# Patient Record
Sex: Female | Born: 1995 | Race: Black or African American | Hispanic: No | Marital: Single | Smoking: Never smoker
Health system: Southern US, Community
[De-identification: ages and names within clinical notes are randomized; demographics above are authoritative.]

---
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
## 2017-07-12 IMAGING — DX DG SHOULDER 2+V*R*
3 series · 3 of 3 positions shown · non-contrast
Comparison: None.

CLINICAL DATA: Right shoulder pain upon waking today.

EXAM:
RIGHT SHOULDER - 2+ VIEW

[shoulder grashey]
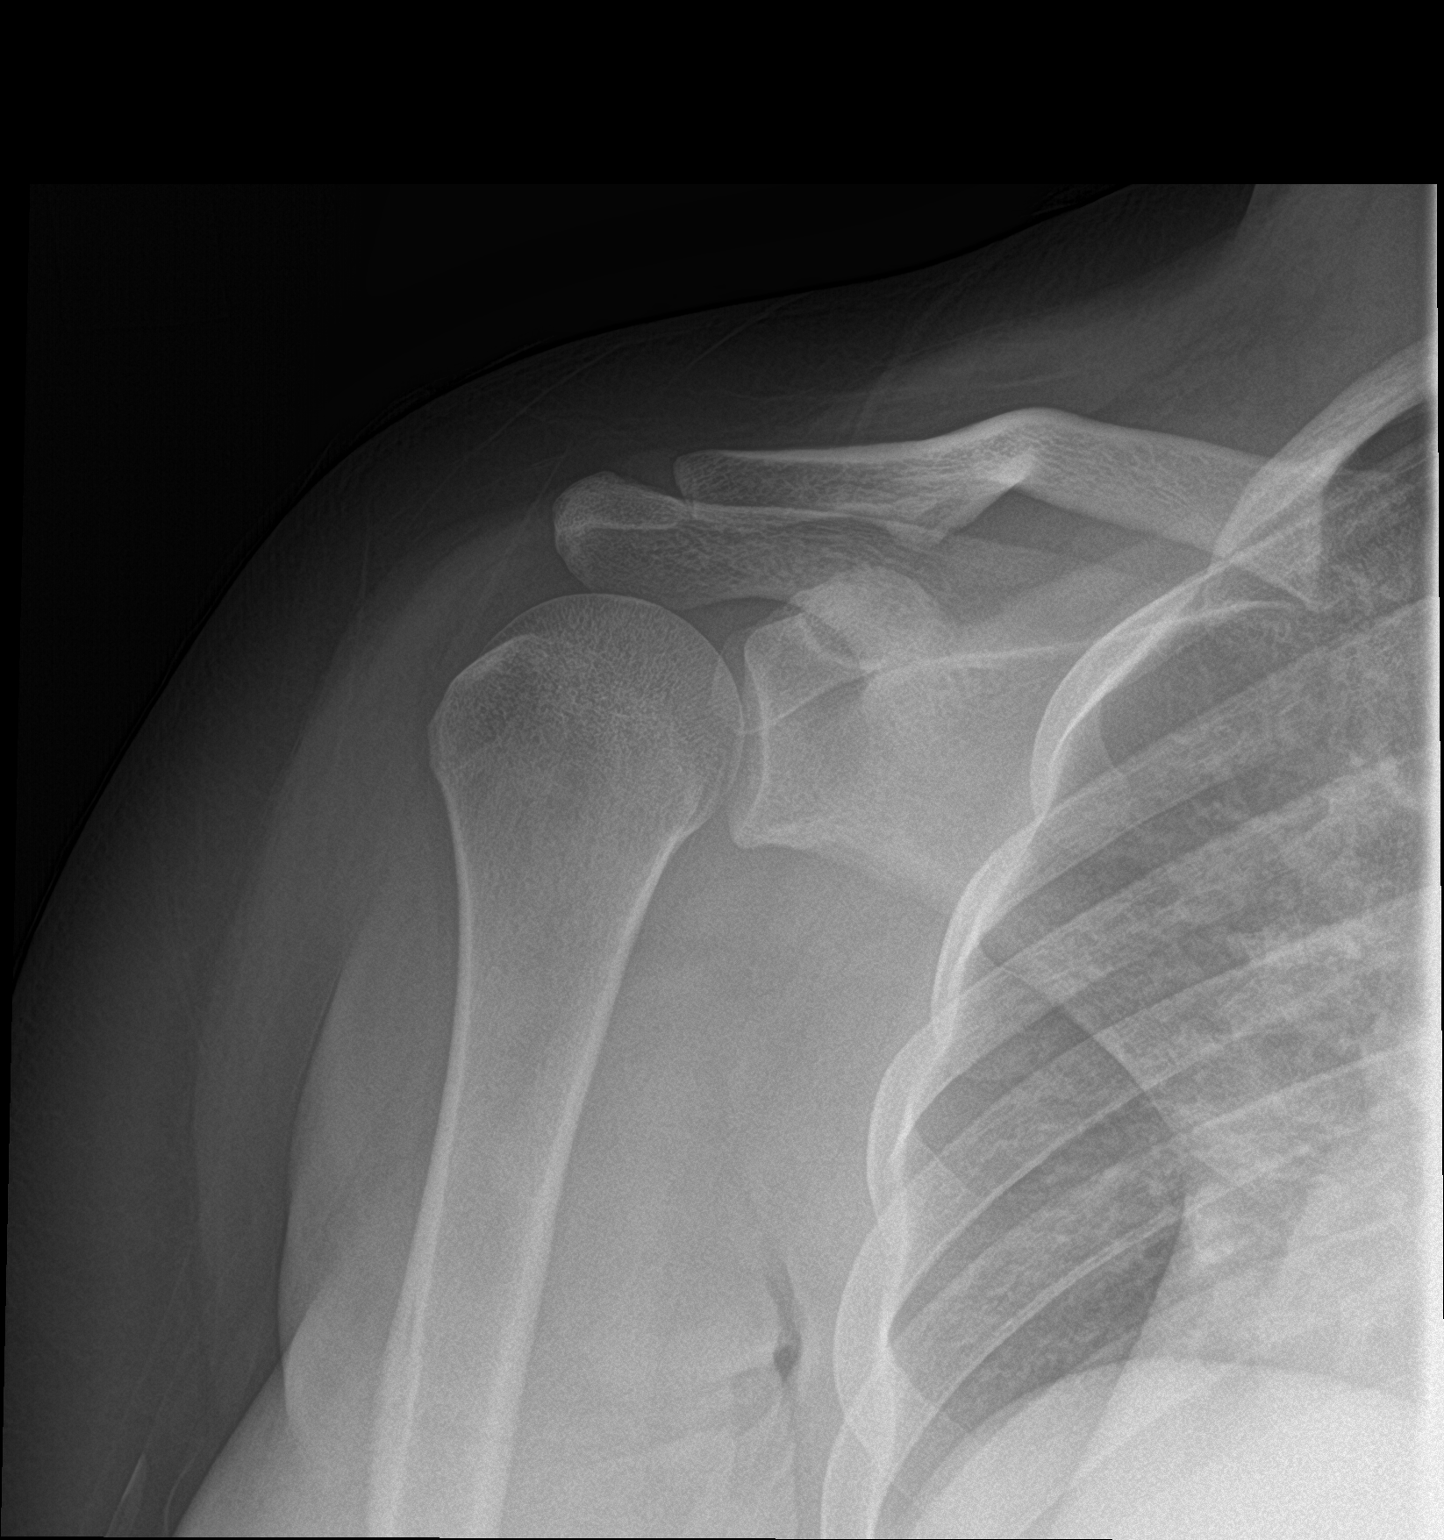

[shoulder y view]
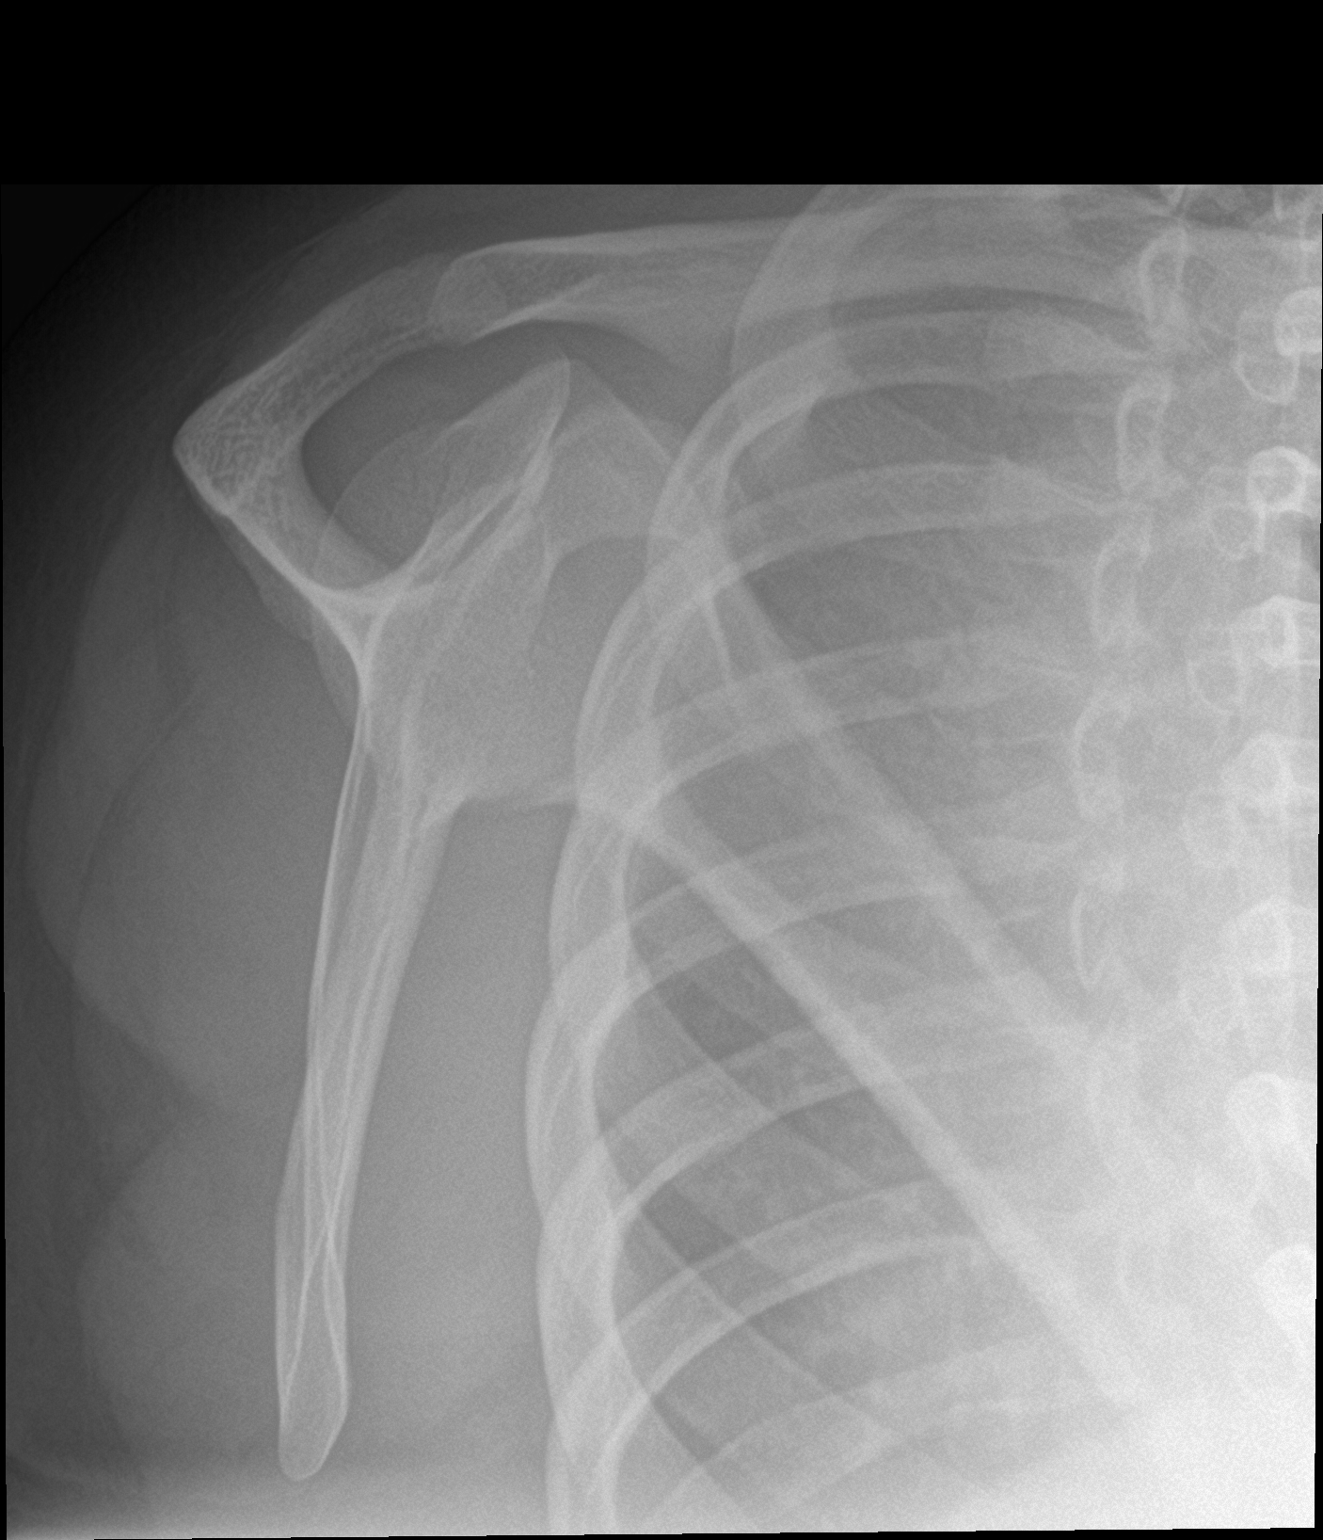

[shoulder axillary]
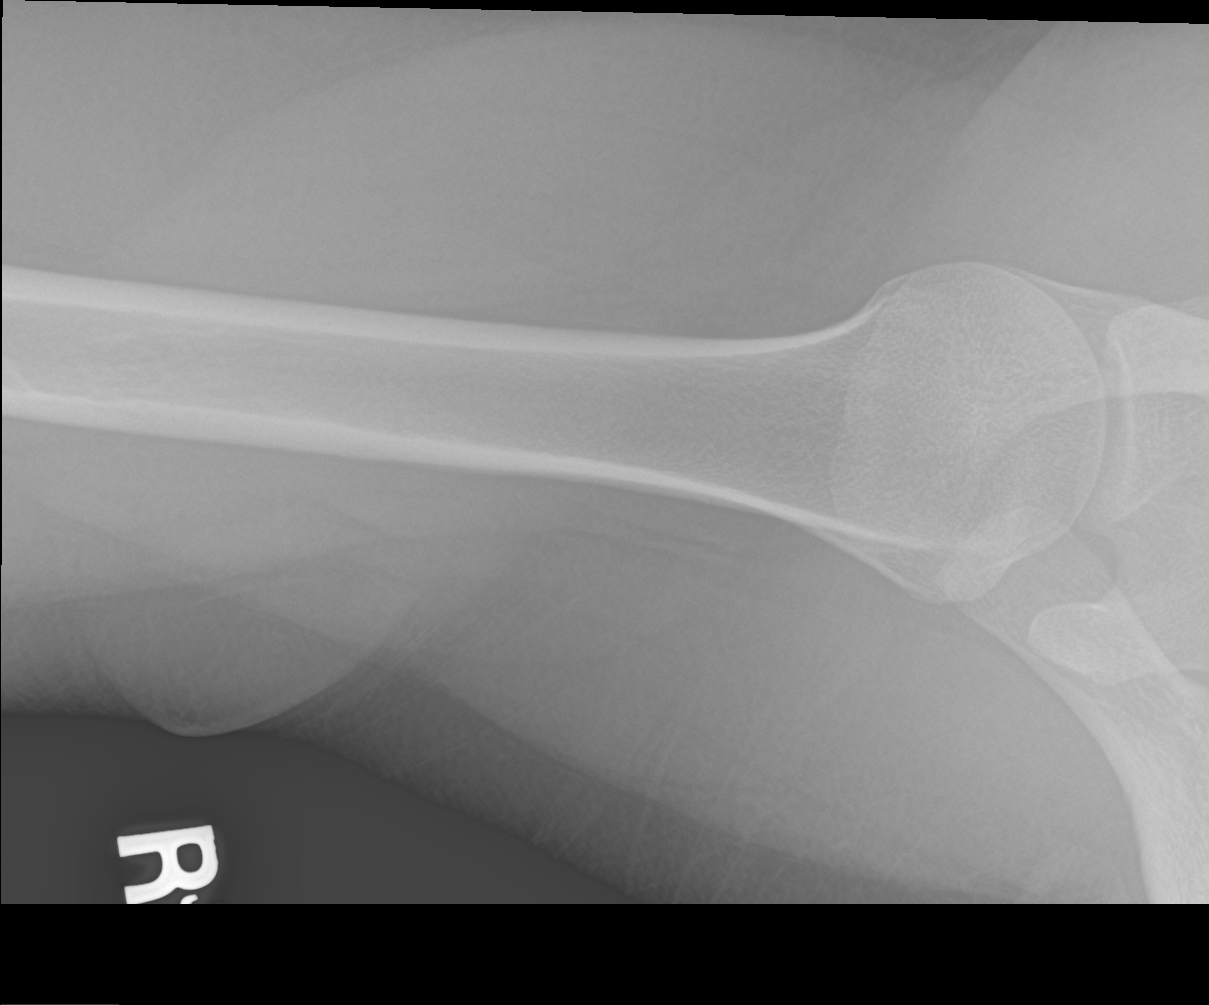

[3 of 3 positions shown; findings below may reference images not displayed]

FINDINGS: There is no evidence of fracture or dislocation. There is no
evidence of arthropathy or other focal bone abnormality. Soft
tissues are unremarkable.
IMPRESSION: Negative right shoulder radiographs.
